# Patient Record
Sex: Female | Born: 1960 | Race: Black or African American | Hispanic: No | Marital: Married
Health system: Southern US, Community
[De-identification: ages and names within clinical notes are randomized; demographics above are authoritative.]

## PROBLEM LIST (undated history)

## (undated) DIAGNOSIS — D649 Anemia, unspecified: Secondary | ICD-10-CM

## (undated) DIAGNOSIS — G4730 Sleep apnea, unspecified: Secondary | ICD-10-CM

## (undated) DIAGNOSIS — E039 Hypothyroidism, unspecified: Secondary | ICD-10-CM

## (undated) DIAGNOSIS — K219 Gastro-esophageal reflux disease without esophagitis: Secondary | ICD-10-CM

## (undated) DIAGNOSIS — M5030 Other cervical disc degeneration, unspecified cervical region: Secondary | ICD-10-CM

## (undated) DIAGNOSIS — F419 Anxiety disorder, unspecified: Secondary | ICD-10-CM

## (undated) DIAGNOSIS — J329 Chronic sinusitis, unspecified: Secondary | ICD-10-CM

## (undated) HISTORY — PX: TUBAL LIGATION: SHX77

## (undated) HISTORY — PX: SKIN BIOPSY: SHX1

---

## 2005-11-30 ENCOUNTER — Ambulatory Visit: Payer: Self-pay

## 2008-10-08 ENCOUNTER — Ambulatory Visit: Payer: Self-pay

## 2008-10-10 ENCOUNTER — Ambulatory Visit: Payer: Self-pay | Admitting: Internal Medicine

## 2008-10-31 ENCOUNTER — Ambulatory Visit: Payer: Self-pay | Admitting: Internal Medicine

## 2008-12-03 ENCOUNTER — Ambulatory Visit: Payer: Self-pay | Admitting: Gastroenterology

## 2009-07-03 ENCOUNTER — Ambulatory Visit: Payer: Self-pay | Admitting: Internal Medicine

## 2009-07-10 ENCOUNTER — Ambulatory Visit: Payer: Self-pay | Admitting: Internal Medicine

## 2010-06-18 ENCOUNTER — Ambulatory Visit: Payer: Self-pay | Admitting: Internal Medicine

## 2011-05-10 ENCOUNTER — Ambulatory Visit: Payer: Self-pay | Admitting: Internal Medicine

## 2012-11-06 ENCOUNTER — Ambulatory Visit: Payer: Self-pay | Admitting: Internal Medicine

## 2013-12-05 ENCOUNTER — Ambulatory Visit: Payer: Self-pay | Admitting: Internal Medicine

## 2015-05-06 ENCOUNTER — Other Ambulatory Visit: Payer: Self-pay | Admitting: Internal Medicine

## 2015-05-06 DIAGNOSIS — R55 Syncope and collapse: Secondary | ICD-10-CM

## 2015-05-06 DIAGNOSIS — M5136 Other intervertebral disc degeneration, lumbar region: Secondary | ICD-10-CM

## 2015-05-06 DIAGNOSIS — M79602 Pain in left arm: Secondary | ICD-10-CM

## 2015-05-06 DIAGNOSIS — M51369 Other intervertebral disc degeneration, lumbar region without mention of lumbar back pain or lower extremity pain: Secondary | ICD-10-CM

## 2015-05-06 DIAGNOSIS — R200 Anesthesia of skin: Secondary | ICD-10-CM

## 2015-05-30 ENCOUNTER — Ambulatory Visit
Admission: RE | Admit: 2015-05-30 | Discharge: 2015-05-30 | Disposition: A | Discharge status: Home / Self Care | Payer: BC Managed Care – PPO | Source: Ambulatory Visit | Attending: Internal Medicine | Admitting: Internal Medicine

## 2015-05-30 DIAGNOSIS — M5136 Other intervertebral disc degeneration, lumbar region: Secondary | ICD-10-CM

## 2015-05-30 DIAGNOSIS — M79602 Pain in left arm: Secondary | ICD-10-CM

## 2015-05-30 DIAGNOSIS — M4802 Spinal stenosis, cervical region: Secondary | ICD-10-CM | POA: Insufficient documentation

## 2015-05-30 DIAGNOSIS — R200 Anesthesia of skin: Secondary | ICD-10-CM | POA: Diagnosis present

## 2015-05-30 DIAGNOSIS — R55 Syncope and collapse: Secondary | ICD-10-CM

## 2015-06-04 ENCOUNTER — Ambulatory Visit: Payer: BC Managed Care – PPO

## 2015-10-24 ENCOUNTER — Other Ambulatory Visit: Payer: Self-pay | Admitting: Internal Medicine

## 2015-10-24 DIAGNOSIS — Z1231 Encounter for screening mammogram for malignant neoplasm of breast: Secondary | ICD-10-CM

## 2015-11-25 ENCOUNTER — Ambulatory Visit: Payer: BC Managed Care – PPO | Attending: Internal Medicine

## 2016-03-05 ENCOUNTER — Ambulatory Visit
Admission: RE | Admit: 2016-03-05 | Discharge: 2016-03-05 | Disposition: A | Discharge status: Home / Self Care | Payer: BC Managed Care – PPO | Source: Ambulatory Visit | Attending: Internal Medicine | Admitting: Internal Medicine

## 2016-03-05 DIAGNOSIS — Z1231 Encounter for screening mammogram for malignant neoplasm of breast: Secondary | ICD-10-CM | POA: Insufficient documentation

## 2017-02-07 ENCOUNTER — Other Ambulatory Visit: Payer: Self-pay | Admitting: Internal Medicine

## 2017-02-07 DIAGNOSIS — Z1231 Encounter for screening mammogram for malignant neoplasm of breast: Secondary | ICD-10-CM

## 2017-03-14 ENCOUNTER — Ambulatory Visit
Admission: RE | Admit: 2017-03-14 | Discharge: 2017-03-14 | Disposition: A | Discharge status: Home / Self Care | Payer: BC Managed Care – PPO | Source: Ambulatory Visit | Attending: Internal Medicine | Admitting: Internal Medicine

## 2017-03-14 DIAGNOSIS — Z1231 Encounter for screening mammogram for malignant neoplasm of breast: Secondary | ICD-10-CM | POA: Insufficient documentation

## 2018-02-22 ENCOUNTER — Other Ambulatory Visit: Payer: Self-pay | Admitting: Internal Medicine

## 2018-02-22 DIAGNOSIS — Z1231 Encounter for screening mammogram for malignant neoplasm of breast: Secondary | ICD-10-CM

## 2018-06-22 ENCOUNTER — Other Ambulatory Visit: Payer: Self-pay | Admitting: Internal Medicine

## 2018-06-22 DIAGNOSIS — N644 Mastodynia: Secondary | ICD-10-CM

## 2018-07-06 ENCOUNTER — Other Ambulatory Visit: Payer: Self-pay | Admitting: Gastroenterology

## 2018-07-06 DIAGNOSIS — R1310 Dysphagia, unspecified: Secondary | ICD-10-CM

## 2018-07-13 ENCOUNTER — Ambulatory Visit
Admission: RE | Admit: 2018-07-13 | Discharge: 2018-07-13 | Disposition: A | Discharge status: Home / Self Care | Payer: BC Managed Care – PPO | Source: Ambulatory Visit | Attending: Internal Medicine | Admitting: Internal Medicine

## 2018-07-13 ENCOUNTER — Other Ambulatory Visit: Payer: Self-pay

## 2018-07-13 DIAGNOSIS — N644 Mastodynia: Secondary | ICD-10-CM | POA: Diagnosis present

## 2018-07-19 ENCOUNTER — Ambulatory Visit
Admission: RE | Admit: 2018-07-19 | Discharge: 2018-07-19 | Disposition: A | Discharge status: Home / Self Care | Payer: BC Managed Care – PPO | Source: Ambulatory Visit | Attending: Gastroenterology | Admitting: Gastroenterology

## 2018-07-19 ENCOUNTER — Other Ambulatory Visit: Payer: Self-pay

## 2018-07-19 DIAGNOSIS — R1310 Dysphagia, unspecified: Secondary | ICD-10-CM | POA: Diagnosis present

## 2019-05-14 ENCOUNTER — Other Ambulatory Visit
Admission: RE | Admit: 2019-05-14 | Discharge: 2019-05-14 | Disposition: A | Discharge status: Home / Self Care | Payer: BC Managed Care – PPO | Source: Ambulatory Visit | Attending: Internal Medicine | Admitting: Internal Medicine

## 2019-05-14 ENCOUNTER — Other Ambulatory Visit: Payer: Self-pay

## 2019-05-14 DIAGNOSIS — Z20822 Contact with and (suspected) exposure to covid-19: Secondary | ICD-10-CM | POA: Insufficient documentation

## 2019-05-14 DIAGNOSIS — Z01812 Encounter for preprocedural laboratory examination: Secondary | ICD-10-CM | POA: Diagnosis not present

## 2019-05-14 LAB — SARS CORONAVIRUS 2 (TAT 6-24 HRS): SARS Coronavirus 2: NEGATIVE

## 2019-05-15 ENCOUNTER — Encounter: Payer: Self-pay | Admitting: Internal Medicine

## 2019-05-16 ENCOUNTER — Ambulatory Visit: Payer: BC Managed Care – PPO | Admitting: Anesthesiology

## 2019-05-16 ENCOUNTER — Ambulatory Visit
Admission: RE | Admit: 2019-05-16 | Discharge: 2019-05-16 | Disposition: A | Discharge status: Home / Self Care | Payer: BC Managed Care – PPO | Source: Ambulatory Visit | Attending: Internal Medicine | Admitting: Internal Medicine

## 2019-05-16 ENCOUNTER — Other Ambulatory Visit: Payer: Self-pay

## 2019-05-16 ENCOUNTER — Encounter
Admission: RE | Disposition: A | Discharge status: Home / Self Care | Payer: Self-pay | Source: Ambulatory Visit | Attending: Internal Medicine

## 2019-05-16 ENCOUNTER — Encounter: Payer: Self-pay | Admitting: Internal Medicine

## 2019-05-16 DIAGNOSIS — K640 First degree hemorrhoids: Secondary | ICD-10-CM | POA: Diagnosis not present

## 2019-05-16 DIAGNOSIS — K449 Diaphragmatic hernia without obstruction or gangrene: Secondary | ICD-10-CM | POA: Insufficient documentation

## 2019-05-16 DIAGNOSIS — F419 Anxiety disorder, unspecified: Secondary | ICD-10-CM | POA: Diagnosis not present

## 2019-05-16 DIAGNOSIS — K219 Gastro-esophageal reflux disease without esophagitis: Secondary | ICD-10-CM | POA: Diagnosis not present

## 2019-05-16 DIAGNOSIS — K228 Other specified diseases of esophagus: Secondary | ICD-10-CM | POA: Diagnosis not present

## 2019-05-16 DIAGNOSIS — Z1211 Encounter for screening for malignant neoplasm of colon: Secondary | ICD-10-CM | POA: Insufficient documentation

## 2019-05-16 DIAGNOSIS — R1310 Dysphagia, unspecified: Secondary | ICD-10-CM | POA: Diagnosis present

## 2019-05-16 DIAGNOSIS — E039 Hypothyroidism, unspecified: Secondary | ICD-10-CM | POA: Insufficient documentation

## 2019-05-16 DIAGNOSIS — Z79899 Other long term (current) drug therapy: Secondary | ICD-10-CM | POA: Insufficient documentation

## 2019-05-16 DIAGNOSIS — Z79890 Hormone replacement therapy (postmenopausal): Secondary | ICD-10-CM | POA: Insufficient documentation

## 2019-05-16 DIAGNOSIS — G4730 Sleep apnea, unspecified: Secondary | ICD-10-CM | POA: Insufficient documentation

## 2019-05-16 DIAGNOSIS — K2970 Gastritis, unspecified, without bleeding: Secondary | ICD-10-CM | POA: Insufficient documentation

## 2019-05-16 HISTORY — DX: Other cervical disc degeneration, unspecified cervical region: M50.30

## 2019-05-16 HISTORY — DX: Anxiety disorder, unspecified: F41.9

## 2019-05-16 HISTORY — DX: Chronic sinusitis, unspecified: J32.9

## 2019-05-16 HISTORY — DX: Anemia, unspecified: D64.9

## 2019-05-16 HISTORY — DX: Sleep apnea, unspecified: G47.30

## 2019-05-16 HISTORY — PX: COLONOSCOPY WITH PROPOFOL: SHX5780

## 2019-05-16 HISTORY — DX: Gastro-esophageal reflux disease without esophagitis: K21.9

## 2019-05-16 HISTORY — PX: ESOPHAGOGASTRODUODENOSCOPY (EGD) WITH PROPOFOL: SHX5813

## 2019-05-16 HISTORY — DX: Hypothyroidism, unspecified: E03.9

## 2019-05-16 SURGERY — ESOPHAGOGASTRODUODENOSCOPY (EGD) WITH PROPOFOL
Anesthesia: General

## 2019-05-16 MED ORDER — LIDOCAINE HCL (CARDIAC) PF 100 MG/5ML IV SOSY
PREFILLED_SYRINGE | INTRAVENOUS | Status: DC | PRN
  Administered 2019-05-16: 25 mg via INTRAVENOUS

## 2019-05-16 MED ORDER — SODIUM CHLORIDE 0.9 % IV SOLN
INTRAVENOUS | Status: DC
  Administered 2019-05-16: 1000 mL via INTRAVENOUS

## 2019-05-16 MED ORDER — GLYCOPYRROLATE 0.2 MG/ML IJ SOLN
INTRAMUSCULAR | Status: DC | PRN
  Administered 2019-05-16: .1 mg via INTRAVENOUS

## 2019-05-16 MED ORDER — PROPOFOL 10 MG/ML IV BOLUS
INTRAVENOUS | Status: DC | PRN
  Administered 2019-05-16: 20 mg via INTRAVENOUS
  Administered 2019-05-16 (×2): 40 mg via INTRAVENOUS
  Administered 2019-05-16: 30 mg via INTRAVENOUS
  Administered 2019-05-16: 20 mg via INTRAVENOUS
  Administered 2019-05-16: 30 mg via INTRAVENOUS

## 2019-05-16 MED ORDER — PROPOFOL 500 MG/50ML IV EMUL
INTRAVENOUS | Status: DC | PRN
  Administered 2019-05-16: 100 ug/kg/min via INTRAVENOUS

## 2019-05-16 MED ORDER — PROPOFOL 10 MG/ML IV BOLUS
INTRAVENOUS | Status: AC
  Filled 2019-05-16: qty 20

## 2019-05-16 MED ORDER — PROPOFOL 500 MG/50ML IV EMUL
INTRAVENOUS | Status: AC
  Filled 2019-05-16: qty 50

## 2019-05-16 NOTE — Interval H&P Note (Signed)
History and Physical Interval Note:  05/16/2019 1:36 PM  Kylie Kelly  has presented today for surgery, with the diagnosis of DYSPHAGIA,SCREENING COLON PROCEDRE.  The various methods of treatment have been discussed with the patient and family. After consideration of risks, benefits and other options for treatment, the patient has consented to  Procedure(s): ESOPHAGOGASTRODUODENOSCOPY (EGD) WITH PROPOFOL (N/A) COLONOSCOPY WITH PROPOFOL (N/A) as a surgical intervention.  The patient's history has been reviewed, patient examined, no change in status, stable for surgery.  I have reviewed the patient's chart and labs.  Questions were answered to the patient's satisfaction.     [place], [place]

## 2019-05-16 NOTE — Op Note (Signed)
[facility] Gastroenterology Patient Name: Kylie Kelly Procedure Date: 05/16/2019 1:25 PM MRN: [id number] Account #: [id number] Date of Birth: 1960-06-10 Admit Type: Outpatient Age: 59 Room: [facility] ENDO ROOM 3 Gender: Female Note Status: Finalized Procedure:             Colonoscopy Indications:           Screening for colorectal malignant neoplasm Providers:             Benay Pike. Shelle Galdamez MD, MD Medicines:             Propofol per Anesthesia Complications:         No immediate complications. Procedure:             Pre-Anesthesia Assessment:                        - The risks and benefits of the procedure and the                         sedation options and risks were discussed with the                         patient. All questions were answered and informed                         consent was obtained.                        - Patient identification and proposed procedure were                         verified prior to the procedure by the nurse. The                         procedure was verified in the procedure room.                        - ASA Grade Assessment: III - A patient with severe                         systemic disease.                        - After reviewing the risks and benefits, the patient                         was deemed in satisfactory condition to undergo the                         procedure.                        After obtaining informed consent, the colonoscope was                         passed under direct vision. Throughout the procedure,                         the patient's blood pressure, pulse, and oxygen  saturations were monitored continuously. The                         Colonoscope was introduced through the anus and                         advanced to the the cecum, identified by appendiceal                         orifice and ileocecal valve. The colonoscopy was                         performed  without difficulty. The patient tolerated                         the procedure well. The quality of the bowel                         preparation was excellent. The ileocecal valve,                         appendiceal orifice, and rectum were photographed. Findings:      The perianal and digital rectal examinations were normal. Pertinent       negatives include normal sphincter tone and no palpable rectal lesions.      The colon (entire examined portion) appeared normal.      Non-bleeding internal hemorrhoids were found during retroflexion. The       hemorrhoids were Grade I (internal hemorrhoids that do not prolapse).      The exam was otherwise without abnormality. Impression:            - The entire examined colon is normal.                        - Non-bleeding internal hemorrhoids.                        - The examination was otherwise normal.                        - No specimens collected. Recommendation:        - Patient has a contact number available for                         emergencies. The signs and symptoms of potential                         delayed complications were discussed with the patient.                         Return to normal activities tomorrow. Written                         discharge instructions were provided to the patient.                        - Resume previous diet.                        - Continue present medications.                        -  Repeat colonoscopy in 10 years for screening                         purposes.                        - Return to GI office in 6 months.                        - The findings and recommendations were discussed with                         the patient. Procedure Code(s):     --- Professional ---                        D3570, Colorectal cancer screening; colonoscopy on                         individual not meeting criteria for high risk Diagnosis Code(s):     --- Professional ---                        K64.0,  First degree hemorrhoids                        Z12.11, Encounter for screening for malignant neoplasm                         of colon CPT copyright 2019 American [employer] Association. All rights reserved. The codes documented in this report are preliminary and upon coder review may  be revised to meet current compliance requirements. Stanton Kidney MD, MD 05/16/2019 2:02:10 PM This report has been signed electronically. Number of Addenda: 0 Note Initiated On: 05/16/2019 1:25 PM Scope Withdrawal Time: 0 hours 6 minutes 2 seconds  Total Procedure Duration: 0 hours 10 minutes 51 seconds  Estimated Blood Loss:  Estimated blood loss: none.      [facility]

## 2019-05-16 NOTE — Op Note (Signed)
[facility] Gastroenterology Patient Name: Kylie Kelly Procedure Date: 05/16/2019 1:25 PM MRN: [id number] Account #: [id number] Date of Birth: May 27, 1960 Admit Type: Outpatient Age: 59 Room: [facility] ENDO ROOM 3 Gender: Female Note Status: Finalized Procedure:             Upper GI endoscopy Indications:           Dysphagia Providers:             Boykin Nearing. Ethan Kasperski MD, MD Medicines:             Propofol per Anesthesia Complications:         No immediate complications. Procedure:             Pre-Anesthesia Assessment:                        - The risks and benefits of the procedure and the                         sedation options and risks were discussed with the                         patient. All questions were answered and informed                         consent was obtained.                        - Patient identification and proposed procedure were                         verified prior to the procedure by the nurse. The                         procedure was verified in the procedure room.                        - ASA Grade Assessment: III - A patient with severe                         systemic disease.                        - After reviewing the risks and benefits, the patient                         was deemed in satisfactory condition to undergo the                         procedure.                        After obtaining informed consent, the endoscope was                         passed under direct vision. Throughout the procedure,                         the patient's blood pressure, pulse, and oxygen  saturations were monitored continuously. The Endoscope                         was introduced through the mouth, and advanced to the                         third part of duodenum. The upper GI endoscopy was                         accomplished without difficulty. The patient tolerated                         the procedure  well. Findings:      The Z-line was irregular and was found at the gastroesophageal junction.       Mucosa was biopsied with a cold forceps for histology. One specimen       bottle was sent to pathology.      No endoscopic abnormality was evident in the esophagus to explain the       patient's complaint of dysphagia.      A 1 cm hiatal hernia was present.      Patchy minimal inflammation characterized by erythema was found in the       gastric antrum.      The examined duodenum was normal. Impression:            - Z-line irregular, at the gastroesophageal junction.                         Biopsied.                        - No endoscopic esophageal abnormality to explain                         patient's dysphagia.                        - 1 cm hiatal hernia.                        - Gastritis.                        - Normal examined duodenum. Recommendation:        - Await pathology results.                        - Proceed with colonoscopy Procedure Code(s):     --- Professional ---                        [phone], Esophagogastroduodenoscopy, flexible,                         transoral; with biopsy, single or multiple Diagnosis Code(s):     --- Professional ---                        K29.70, Gastritis, unspecified, without bleeding                        K44.9, Diaphragmatic hernia without obstruction or  gangrene                        R13.10, Dysphagia, unspecified                        K22.8, Other specified diseases of esophagus CPT copyright 2019 American Medical Association. All rights reserved. The codes documented in this report are preliminary and upon coder review may  be revised to meet current compliance requirements. Efrain Sella MD, MD 05/16/2019 1:44:31 PM This report has been signed electronically. Number of Addenda: 0 Note Initiated On: 05/16/2019 1:25 PM Estimated Blood Loss:  Estimated blood loss: none.      [facility]

## 2019-05-16 NOTE — H&P (Signed)
Outpatient short stay form Pre-procedure 05/16/2019 11:40 AM Kylie Kelly K. Norma Fredrickson, M.D.  Primary Physician: Daniel Nones, M.D.  Reason for visit:  Esophageal dysphagia, Barium swallow on July 19, 2018 showed some laryngeal penetration without aspiration, esophagus was normal, stomach normal.  History of present illness:  AS above. Patient presents for colonoscopy for colon cancer screening. The patient denies complaints of abdominal pain, significant change in bowel habits, or rectal bleeding. Occcasional constipation.  Patient has intermittent dysphagia to both liquids and solids usually to the level of the throat with some coughing if eating too fast. No frank GERD symptoms. Patient states that over the past few months her swallowing has improved with daily PPI therapy.   No current facility-administered medications for this encounter.  Current Outpatient Medications:  .  albuterol (VENTOLIN HFA) 108 (90 Base) MCG/ACT inhaler, Inhale 2 puffs into the lungs every 4 (four) hours as needed for wheezing or shortness of breath., Disp: , Rfl:  .  azelastine (ASTELIN) 0.1 % nasal spray, Place 1 spray into both nostrils 2 (two) times daily. Use in each nostril as directed, Disp: , Rfl:  .  busPIRone (BUSPAR) 15 MG tablet, Take 15 mg by mouth 3 (three) times daily., Disp: , Rfl:  .  conjugated estrogens (PREMARIN) vaginal cream, Place 1 Applicatorful vaginally daily., Disp: , Rfl:  .  cyanocobalamin 1000 MCG tablet, Take 1,000 mcg by mouth daily., Disp: , Rfl:  .  desonide (DESOWEN) 0.05 % lotion, Apply 1 application topically 2 (two) times daily., Disp: , Rfl:  .  EQL MAGNESIUM CITRATE PO, Take 1,000 mg by mouth 1 day or 1 dose., Disp: , Rfl:  .  famotidine (PEPCID) 20 MG tablet, Take 20 mg by mouth 2 (two) times daily., Disp: , Rfl:  .  hydroquinone 2 % cream, Apply 4 % topically 2 (two) times daily., Disp: , Rfl:  .  levothyroxine (SYNTHROID) 50 MCG tablet, Take 50 mcg by mouth daily before breakfast.,  Disp: , Rfl:  .  loratadine (CLARITIN) 10 MG tablet, Take 10 mg by mouth daily., Disp: , Rfl:  .  meclizine (ANTIVERT) 25 MG tablet, Take 0.5 mg by mouth 3 (three) times daily as needed for dizziness., Disp: , Rfl:  .  Multiple Vitamin (MULTIVITAMIN) tablet, Take 1 tablet by mouth daily., Disp: , Rfl:  .  Oxybutynin Chloride (DITROPAN XL PO), Take 10 mg by mouth daily., Disp: , Rfl:  .  Pantoprazole Sodium (PROTONIX PO), Take 40 mg by mouth daily before breakfast., Disp: , Rfl:  .  traZODone (DESYREL) 50 MG tablet, Take 50 mg by mouth at bedtime., Disp: , Rfl:  .  tretinoin (RETIN-A) 0.05 % cream, Apply 0.05 % topically at bedtime., Disp: , Rfl:  .  triamcinolone (NASACORT ALLERGY 24HR) 55 MCG/ACT AERO nasal inhaler, Place 2 sprays into the nose daily., Disp: , Rfl:  .  Turmeric (QC TUMERIC COMPLEX PO), Take by mouth daily., Disp: , Rfl:   No medications prior to admission.     No Known Allergies   Past Medical History:  Diagnosis Date  . Anemia   . Anxiety   . DDD (degenerative disc disease), cervical   . GERD (gastroesophageal reflux disease)   . Hypothyroidism   . Sinus infection   . Sleep apnea     Review of systems:  Otherwise negative.    Physical Exam  Gen: Alert, oriented. Appears stated age.  HEENT: [place]/AT. PERRLA. Lungs: CTA, no wheezes. CV: RR nl S1, S2. Abd:  soft, benign, no masses. BS+ Ext: No edema. Pulses 2+    Planned procedures: Proceed with EGD and colonoscopy. The patient understands the nature of the planned procedure, indications, risks, alternatives and potential complications including but not limited to bleeding, infection, perforation, damage to internal organs and possible oversedation/side effects from anesthesia. The patient agrees and gives consent to proceed.  Please refer to procedure notes for findings, recommendations and patient disposition/instructions.     Kylie Kelly K. Alice Reichert, M.D. Gastroenterology 05/16/2019  11:40 AM

## 2019-05-16 NOTE — Anesthesia Preprocedure Evaluation (Signed)
Anesthesia Evaluation  Patient identified by MRN, date of birth, ID band Patient awake    Reviewed: Allergy & Precautions, H&P , NPO status , Patient's Chart, lab work & pertinent test results  History of Anesthesia Complications Negative for: history of anesthetic complications  Airway Mallampati: III  TM Distance: >3 FB Neck ROM: full    Dental  (+) Chipped   Pulmonary neg shortness of breath, sleep apnea ,    Pulmonary exam normal        Cardiovascular Exercise Tolerance: Good (-) angina(-) Past MI and (-) DOE negative cardio ROS Normal cardiovascular exam     Neuro/Psych PSYCHIATRIC DISORDERS negative neurological ROS     GI/Hepatic Neg liver ROS, GERD  Medicated and Controlled,  Endo/Other  Hypothyroidism   Renal/GU negative Renal ROS  negative genitourinary   Musculoskeletal  (+) Arthritis ,   Abdominal   Peds  Hematology negative hematology ROS (+)   Anesthesia Other Findings Past Medical History: No date: Anemia No date: Anxiety No date: DDD (degenerative disc disease), cervical No date: GERD (gastroesophageal reflux disease) No date: Hypothyroidism No date: Sinus infection No date: Sleep apnea  Past Surgical History: No date: CESAREAN SECTION; N/A No date: SKIN BIOPSY No date: TUBAL LIGATION  BMI    Body Mass Index: 25.79 kg/m      Reproductive/Obstetrics negative OB ROS                             Anesthesia Physical Anesthesia Plan  ASA: III  Anesthesia Plan: General   Post-op Pain Management:    Induction: Intravenous  PONV Risk Score and Plan: Propofol infusion and TIVA  Airway Management Planned: Natural Airway and Nasal Cannula  Additional Equipment:   Intra-op Plan:   Post-operative Plan:   Informed Consent: I have reviewed the patients History and Physical, chart, labs and discussed the procedure including the risks, benefits and  alternatives for the proposed anesthesia with the patient or authorized representative who has indicated his/her understanding and acceptance.     Dental Advisory Given  Plan Discussed with: Anesthesiologist, CRNA and Surgeon  Anesthesia Plan Comments: (Patient consented for risks of anesthesia including but not limited to:  - adverse reactions to medications - risk of intubation if required - damage to eyes, teeth, lips or other oral mucosa - nerve damage due to positioning  - sore throat or hoarseness - Damage to heart, brain, nerves, lungs or loss of life  Patient voiced understanding.)        Anesthesia Quick Evaluation

## 2019-05-16 NOTE — Transfer of Care (Signed)
Immediate Anesthesia Transfer of Care Note  Patient: Kylie Kelly  Procedure(s) Performed: ESOPHAGOGASTRODUODENOSCOPY (EGD) WITH PROPOFOL (N/A ) COLONOSCOPY WITH PROPOFOL (N/A )  Patient Location: PACU  Anesthesia Type:MAC  Level of Consciousness: drowsy  Airway & Oxygen Therapy: Patient Spontanous Breathing and Patient connected to nasal cannula oxygen  Post-op Assessment: Report given to RN and Post -op Vital signs reviewed and stable  Post vital signs: Reviewed and stable  Last Vitals:  Vitals Value Taken Time  BP 135/88 05/16/19 1401  Temp 35.8 C 05/16/19 1401  Pulse 91 05/16/19 1401  Resp 18 05/16/19 1401  SpO2 99 % 05/16/19 1401    Last Pain:  Vitals:   05/16/19 1401  TempSrc: Temporal  PainSc: Asleep         Complications: No apparent anesthesia complications

## 2019-05-17 ENCOUNTER — Encounter: Payer: Self-pay | Admitting: *Deleted

## 2019-05-17 NOTE — Anesthesia Postprocedure Evaluation (Signed)
Anesthesia Post Note  Patient: Kylie Kelly  Procedure(s) Performed: ESOPHAGOGASTRODUODENOSCOPY (EGD) WITH PROPOFOL (N/A ) COLONOSCOPY WITH PROPOFOL (N/A )  Patient location during evaluation: Endoscopy Anesthesia Type: General Level of consciousness: awake and alert Pain management: pain level controlled Vital Signs Assessment: post-procedure vital signs reviewed and stable Respiratory status: spontaneous breathing, nonlabored ventilation, respiratory function stable and patient connected to nasal cannula oxygen Cardiovascular status: blood pressure returned to baseline and stable Postop Assessment: no apparent nausea or vomiting Anesthetic complications: no     Last Vitals:  Vitals:   05/16/19 1421 05/16/19 1431  BP: (!) 148/96 (!) 139/110  Pulse: 79 78  Resp: 14 16  Temp:    SpO2: 99% 100%    Last Pain:  Vitals:   05/16/19 1431  TempSrc:   PainSc: 0-No pain                 Cleda Mccreedy [name]

## 2019-05-18 LAB — SURGICAL PATHOLOGY

## 2019-09-20 ENCOUNTER — Other Ambulatory Visit: Payer: Self-pay | Admitting: Internal Medicine

## 2019-09-20 DIAGNOSIS — Z1231 Encounter for screening mammogram for malignant neoplasm of breast: Secondary | ICD-10-CM

## 2019-10-01 ENCOUNTER — Other Ambulatory Visit: Payer: Self-pay

## 2019-10-01 ENCOUNTER — Ambulatory Visit
Admission: RE | Admit: 2019-10-01 | Discharge: 2019-10-01 | Disposition: A | Discharge status: Home / Self Care | Payer: BC Managed Care – PPO | Source: Ambulatory Visit | Attending: Internal Medicine | Admitting: Internal Medicine

## 2019-10-01 DIAGNOSIS — Z1231 Encounter for screening mammogram for malignant neoplasm of breast: Secondary | ICD-10-CM | POA: Diagnosis not present

## 2019-10-10 ENCOUNTER — Other Ambulatory Visit: Payer: BC Managed Care – PPO

## 2019-10-10 DIAGNOSIS — Z20822 Contact with and (suspected) exposure to covid-19: Secondary | ICD-10-CM

## 2019-10-11 LAB — SARS-COV-2, NAA 2 DAY TAT

## 2019-10-11 LAB — NOVEL CORONAVIRUS, NAA: SARS-CoV-2, NAA: NOT DETECTED

## 2019-10-12 ENCOUNTER — Telehealth: Payer: Self-pay | Admitting: Internal Medicine

## 2019-10-12 NOTE — Telephone Encounter (Signed)
Patient is calling back to receive her COVID test results. Patient expressed understanding. 

## 2020-09-01 ENCOUNTER — Other Ambulatory Visit: Payer: Self-pay | Admitting: Internal Medicine

## 2020-09-01 DIAGNOSIS — Z1231 Encounter for screening mammogram for malignant neoplasm of breast: Secondary | ICD-10-CM

## 2020-10-22 ENCOUNTER — Other Ambulatory Visit: Payer: Self-pay

## 2020-10-22 ENCOUNTER — Ambulatory Visit
Admission: RE | Admit: 2020-10-22 | Discharge: 2020-10-22 | Disposition: A | Discharge status: Home / Self Care | Payer: BC Managed Care – PPO | Source: Ambulatory Visit | Attending: Internal Medicine | Admitting: Internal Medicine

## 2020-10-22 DIAGNOSIS — Z1231 Encounter for screening mammogram for malignant neoplasm of breast: Secondary | ICD-10-CM | POA: Diagnosis not present

## 2021-08-19 ENCOUNTER — Other Ambulatory Visit: Payer: Self-pay | Admitting: Internal Medicine

## 2021-08-19 DIAGNOSIS — R2242 Localized swelling, mass and lump, left lower limb: Secondary | ICD-10-CM

## 2021-08-27 ENCOUNTER — Ambulatory Visit
Admission: RE | Admit: 2021-08-27 | Discharge: 2021-08-27 | Disposition: A | Discharge status: Home / Self Care | Payer: BC Managed Care – PPO | Source: Ambulatory Visit | Attending: Internal Medicine | Admitting: Internal Medicine

## 2021-08-27 DIAGNOSIS — R2242 Localized swelling, mass and lump, left lower limb: Secondary | ICD-10-CM

## 2021-09-08 ENCOUNTER — Other Ambulatory Visit: Payer: BC Managed Care – PPO

## 2021-10-28 ENCOUNTER — Other Ambulatory Visit: Payer: Self-pay | Admitting: Internal Medicine

## 2021-10-28 DIAGNOSIS — Z1231 Encounter for screening mammogram for malignant neoplasm of breast: Secondary | ICD-10-CM

## 2021-10-30 ENCOUNTER — Ambulatory Visit
Admission: RE | Admit: 2021-10-30 | Discharge: 2021-10-30 | Disposition: A | Discharge status: Home / Self Care | Payer: BC Managed Care – PPO | Source: Ambulatory Visit | Attending: Internal Medicine | Admitting: Internal Medicine

## 2021-10-30 DIAGNOSIS — Z1231 Encounter for screening mammogram for malignant neoplasm of breast: Secondary | ICD-10-CM | POA: Diagnosis present

## 2022-07-06 ENCOUNTER — Other Ambulatory Visit: Payer: Self-pay | Admitting: Internal Medicine

## 2022-07-06 DIAGNOSIS — E782 Mixed hyperlipidemia: Secondary | ICD-10-CM

## 2022-07-14 ENCOUNTER — Ambulatory Visit
Admission: RE | Admit: 2022-07-14 | Discharge: 2022-07-14 | Disposition: A | Discharge status: Home / Self Care | Payer: BC Managed Care – PPO | Source: Ambulatory Visit | Attending: Internal Medicine | Admitting: Internal Medicine

## 2022-07-14 DIAGNOSIS — E782 Mixed hyperlipidemia: Secondary | ICD-10-CM | POA: Insufficient documentation

## 2022-10-06 ENCOUNTER — Other Ambulatory Visit: Payer: Self-pay | Admitting: Internal Medicine

## 2022-10-06 DIAGNOSIS — Z1231 Encounter for screening mammogram for malignant neoplasm of breast: Secondary | ICD-10-CM

## 2022-12-03 ENCOUNTER — Ambulatory Visit
Admission: RE | Admit: 2022-12-03 | Discharge: 2022-12-03 | Disposition: A | Discharge status: Home / Self Care | Payer: BC Managed Care – PPO | Source: Ambulatory Visit | Attending: Internal Medicine | Admitting: Internal Medicine

## 2022-12-03 DIAGNOSIS — Z1231 Encounter for screening mammogram for malignant neoplasm of breast: Secondary | ICD-10-CM | POA: Insufficient documentation

## 2023-06-27 IMAGING — MG MM DIGITAL SCREENING BILAT W/ TOMO AND CAD
6 of 10 series · 6 of 30 positions shown · non-contrast
Comparison: Previous exam(s).

CLINICAL DATA: Screening.

EXAM:
DIGITAL SCREENING BILATERAL MAMMOGRAM WITH TOMOSYNTHESIS AND CAD
TECHNIQUE: Bilateral screening digital craniocaudal and mediolateral oblique
mammograms were obtained. Bilateral screening digital breast
tomosynthesis was performed. The images were evaluated with
computer-aided detection.

[R CC synth-2D]
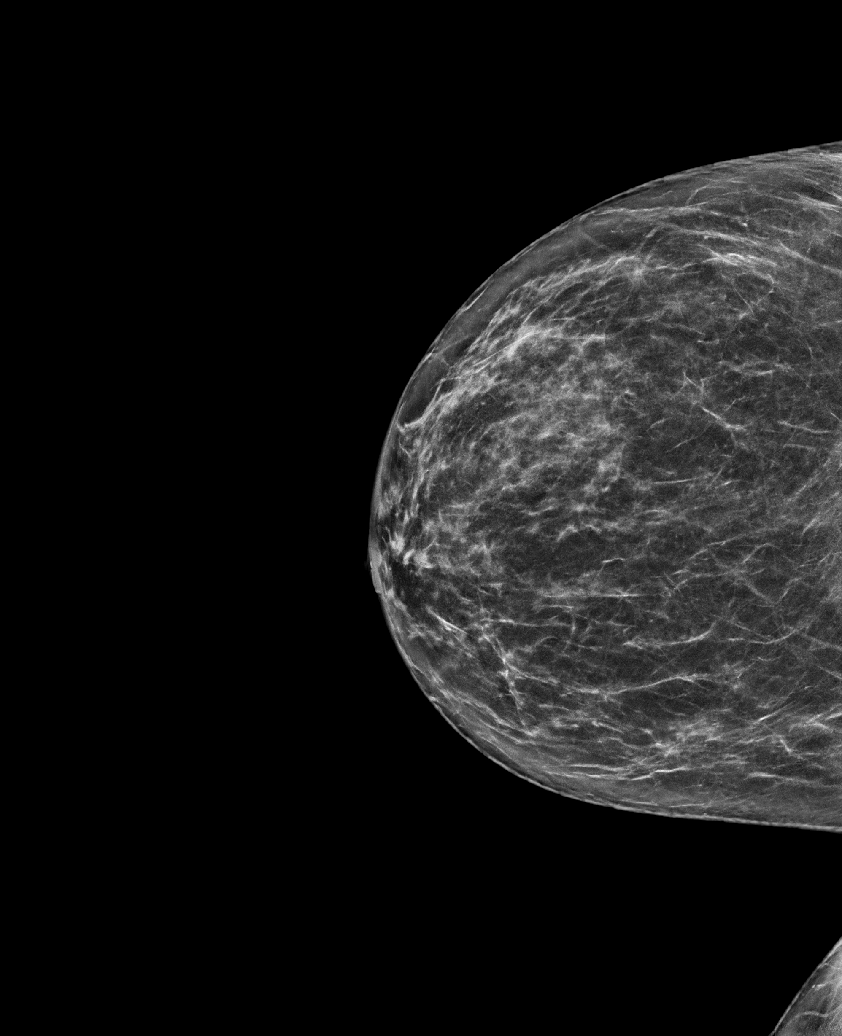

[L XCCL synth-2D]
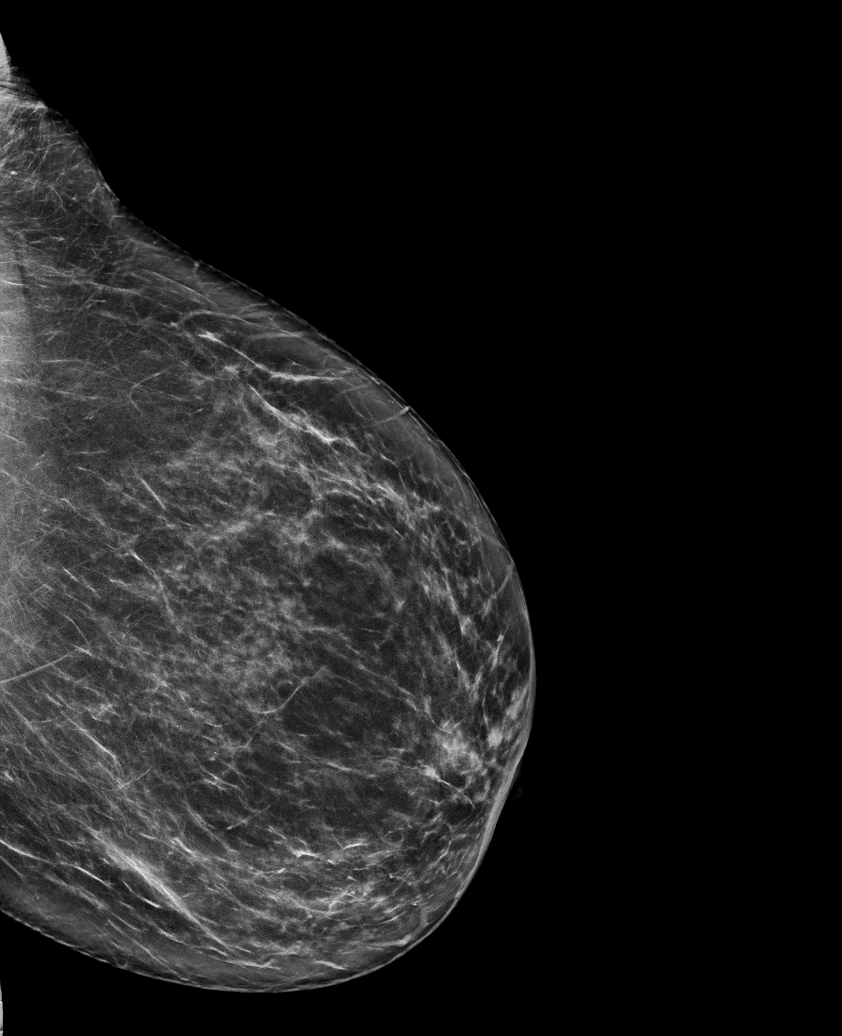

[L MLO synth-2D]
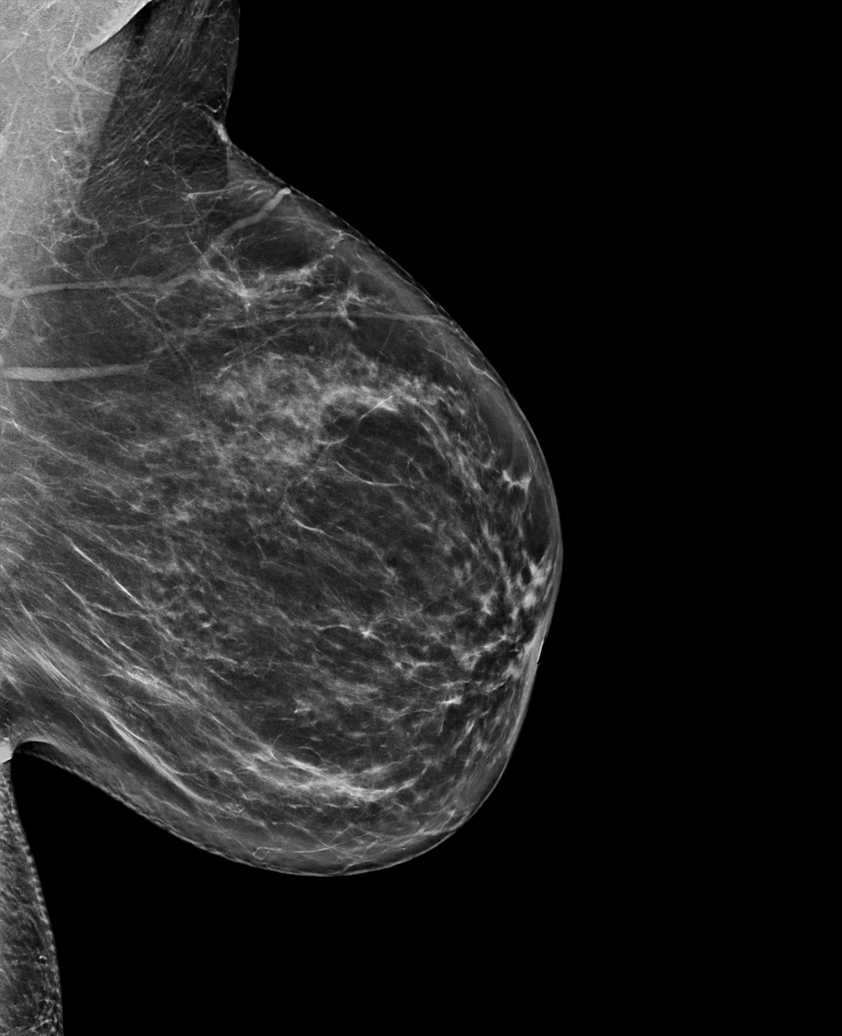

[R MLO synth-2D]
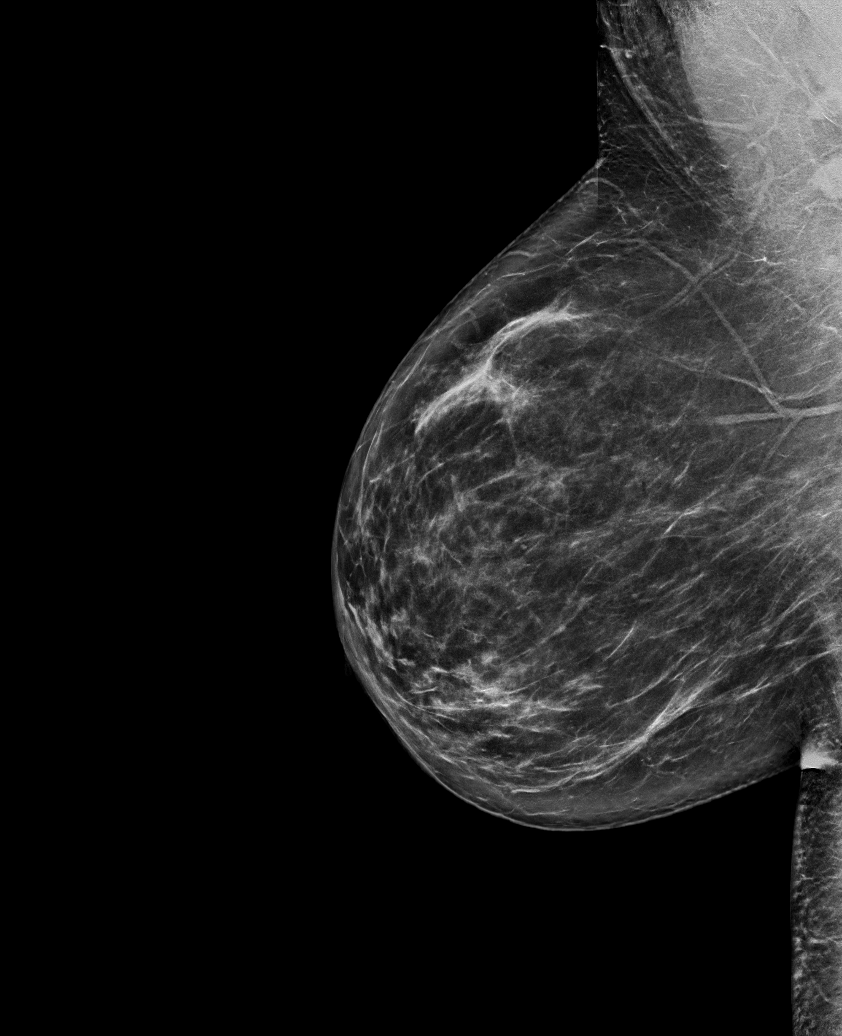

[L CC synth-2D]
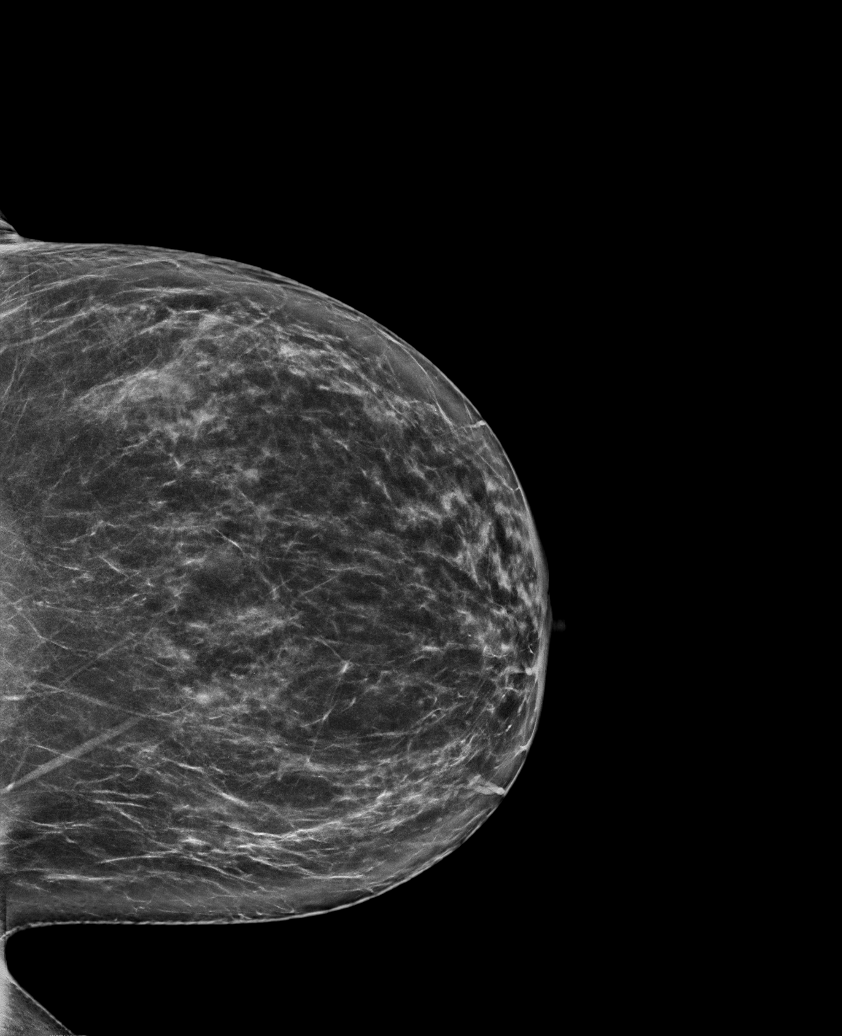

[L MLO tomo · tomo slice 41/81.0]
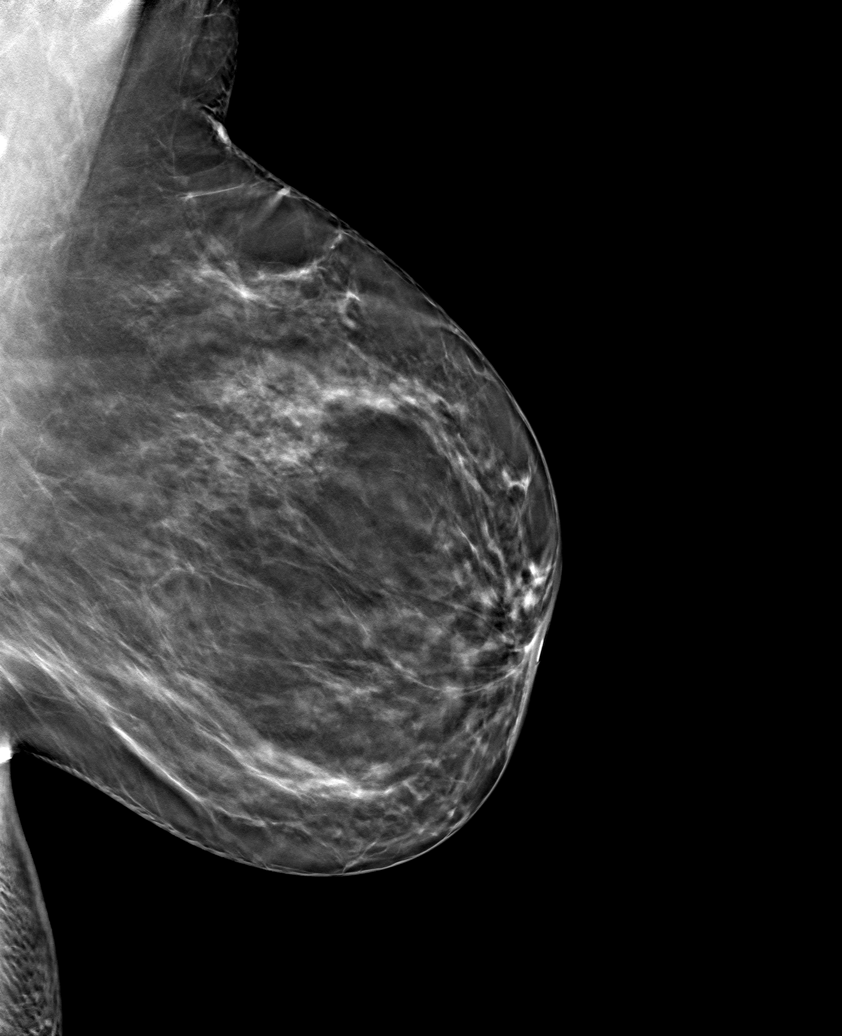

[6 of 30 positions shown; findings below may reference images not displayed]

ACR Breast Density Category c: The breast tissue is heterogeneously
dense, which may obscure small masses.
FINDINGS: There are no findings suspicious for malignancy.
IMPRESSION: No mammographic evidence of malignancy. A result letter of this
screening mammogram will be mailed directly to the patient.

RECOMMENDATION:
Screening mammogram in one year. (Code:Q3-W-BC3)

BI-RADS CATEGORY  1: Negative.

## 2024-01-27 ENCOUNTER — Other Ambulatory Visit: Payer: Self-pay | Admitting: Internal Medicine

## 2024-01-27 DIAGNOSIS — Z1231 Encounter for screening mammogram for malignant neoplasm of breast: Secondary | ICD-10-CM
